# Patient Record
Sex: Male | Born: 1970 | Race: White | Hispanic: No | Marital: Single | Smoking: Current every day smoker
Health system: Southern US, Community
[De-identification: ages and names within clinical notes are randomized; demographics above are authoritative.]

## PROBLEM LIST (undated history)

## (undated) DIAGNOSIS — M5030 Other cervical disc degeneration, unspecified cervical region: Secondary | ICD-10-CM

## (undated) DIAGNOSIS — N319 Neuromuscular dysfunction of bladder, unspecified: Secondary | ICD-10-CM

## (undated) DIAGNOSIS — M069 Rheumatoid arthritis, unspecified: Secondary | ICD-10-CM

## (undated) DIAGNOSIS — I7300 Raynaud's syndrome without gangrene: Secondary | ICD-10-CM

## (undated) DIAGNOSIS — C801 Malignant (primary) neoplasm, unspecified: Secondary | ICD-10-CM

## (undated) DIAGNOSIS — M1990 Unspecified osteoarthritis, unspecified site: Secondary | ICD-10-CM

## (undated) DIAGNOSIS — K3184 Gastroparesis: Secondary | ICD-10-CM

## (undated) DIAGNOSIS — L409 Psoriasis, unspecified: Secondary | ICD-10-CM

## (undated) HISTORY — PX: FINGER AMPUTATION: SHX636

## (undated) HISTORY — PX: OTHER SURGICAL HISTORY: SHX169

## (undated) HISTORY — PX: APPENDECTOMY: SHX54

## (undated) HISTORY — PX: CHOLECYSTECTOMY: SHX55

## (undated) HISTORY — PX: ORCHIECTOMY: SHX2116

---

## 2017-03-26 ENCOUNTER — Emergency Department
Admission: EM | Admit: 2017-03-26 | Discharge: 2017-03-26 | Disposition: A | Discharge status: Home / Self Care | Payer: Medicare Other | Attending: Emergency Medicine | Admitting: Emergency Medicine

## 2017-03-26 ENCOUNTER — Emergency Department: Payer: Medicare Other

## 2017-03-26 DIAGNOSIS — W260XXS Contact with knife, sequela: Secondary | ICD-10-CM | POA: Insufficient documentation

## 2017-03-26 DIAGNOSIS — M79642 Pain in left hand: Secondary | ICD-10-CM | POA: Insufficient documentation

## 2017-03-26 DIAGNOSIS — Z7952 Long term (current) use of systemic steroids: Secondary | ICD-10-CM | POA: Insufficient documentation

## 2017-03-26 DIAGNOSIS — S61432S Puncture wound without foreign body of left hand, sequela: Secondary | ICD-10-CM | POA: Insufficient documentation

## 2017-03-26 DIAGNOSIS — Z79899 Other long term (current) drug therapy: Secondary | ICD-10-CM | POA: Insufficient documentation

## 2017-03-26 DIAGNOSIS — F17200 Nicotine dependence, unspecified, uncomplicated: Secondary | ICD-10-CM | POA: Insufficient documentation

## 2017-03-26 DIAGNOSIS — M1990 Unspecified osteoarthritis, unspecified site: Secondary | ICD-10-CM | POA: Insufficient documentation

## 2017-03-26 DIAGNOSIS — K3184 Gastroparesis: Secondary | ICD-10-CM | POA: Insufficient documentation

## 2017-03-26 HISTORY — DX: Malignant (primary) neoplasm, unspecified: C80.1

## 2017-03-26 HISTORY — DX: Gastroparesis: K31.84

## 2017-03-26 HISTORY — DX: Unspecified osteoarthritis, unspecified site: M19.90

## 2017-03-26 HISTORY — DX: Psoriasis, unspecified: L40.9

## 2017-03-26 LAB — CBC AND DIFFERENTIAL
Absolute NRBC: 0 10*3/uL
Basophils Absolute Automated: 0.06 10*3/uL (ref 0.00–0.20)
Basophils Automated: 0.4 %
Eosinophils Absolute Automated: 0.16 10*3/uL (ref 0.00–0.70)
Eosinophils Automated: 1.2 %
Hematocrit: 35.6 % — ABNORMAL LOW (ref 42.0–52.0)
Hgb: 11.4 g/dL — ABNORMAL LOW (ref 13.0–17.0)
Immature Granulocytes Absolute: 0.05 10*3/uL
Immature Granulocytes: 0.4 %
Lymphocytes Absolute Automated: 2.91 10*3/uL (ref 0.50–4.40)
Lymphocytes Automated: 21.5 %
MCH: 27 pg — ABNORMAL LOW (ref 28.0–32.0)
MCHC: 32 g/dL (ref 32.0–36.0)
MCV: 84.2 fL (ref 80.0–100.0)
MPV: 10 fL (ref 9.4–12.3)
Monocytes Absolute Automated: 1.47 10*3/uL — ABNORMAL HIGH (ref 0.00–1.20)
Monocytes: 10.9 %
Neutrophils Absolute: 8.86 10*3/uL — ABNORMAL HIGH (ref 1.80–8.10)
Neutrophils: 65.6 %
Nucleated RBC: 0 /100 WBC (ref 0.0–1.0)
Platelets: 415 10*3/uL — ABNORMAL HIGH (ref 140–400)
RBC: 4.23 10*6/uL — ABNORMAL LOW (ref 4.70–6.00)
RDW: 14 % (ref 12–15)
WBC: 13.51 10*3/uL — ABNORMAL HIGH (ref 3.50–10.80)

## 2017-03-26 LAB — BASIC METABOLIC PANEL
BUN: 14 mg/dL (ref 9.0–28.0)
CO2: 24 mEq/L (ref 22–29)
Calcium: 8.5 mg/dL (ref 8.5–10.5)
Chloride: 106 mEq/L (ref 100–111)
Creatinine: 0.9 mg/dL (ref 0.7–1.3)
Glucose: 99 mg/dL (ref 70–100)
Potassium: 4.2 mEq/L (ref 3.5–5.1)
Sodium: 138 mEq/L (ref 136–145)

## 2017-03-26 LAB — PT AND APTT
PT INR: 1 (ref 0.9–1.1)
PT: 13.2 s (ref 12.6–15.0)
PTT: 29 s (ref 23–37)

## 2017-03-26 LAB — GFR: EGFR: >60

## 2017-03-26 MED ORDER — HYDROMORPHONE HCL 1 MG/ML IJ SOLN
1.0000 mg | Freq: Once | INTRAMUSCULAR | Status: AC
Start: 2017-03-26 — End: 2017-03-26
  Administered 2017-03-26: 21:00:00 1 mg via INTRAVENOUS
  Filled 2017-03-26: qty 1

## 2017-03-26 MED ORDER — ONDANSETRON HCL 4 MG/2ML IJ SOLN
4.0000 mg | Freq: Once | INTRAMUSCULAR | Status: AC
Start: 2017-03-26 — End: 2017-03-26
  Administered 2017-03-26: 20:00:00 4 mg via INTRAVENOUS
  Filled 2017-03-26: qty 2

## 2017-03-26 MED ORDER — HYDROMORPHONE HCL 1 MG/ML IJ SOLN
1.0000 mg | Freq: Once | INTRAMUSCULAR | Status: AC
Start: 2017-03-26 — End: 2017-03-26
  Administered 2017-03-26: 20:00:00 1 mg via INTRAVENOUS
  Filled 2017-03-26: qty 1

## 2017-03-26 MED ORDER — HYDROMORPHONE HCL 2 MG PO TABS
2.0000 mg | ORAL_TABLET | Freq: Once | ORAL | Status: AC
Start: 2017-03-26 — End: 2017-03-26
  Administered 2017-03-26: 22:00:00 2 mg via ORAL
  Filled 2017-03-26: qty 1

## 2017-03-26 MED ORDER — HYDROMORPHONE HCL 1 MG/ML IJ SOLN
1.0000 mg | Freq: Once | INTRAMUSCULAR | Status: AC
Start: 2017-03-26 — End: 2017-03-26
  Administered 2017-03-26: 1 mg via INTRAVENOUS
  Filled 2017-03-26: qty 1

## 2017-03-26 MED ORDER — DIAZEPAM 5 MG PO TABS
5.0000 mg | ORAL_TABLET | Freq: Once | ORAL | Status: AC
Start: 2017-03-26 — End: 2017-03-26
  Administered 2017-03-26: 22:00:00 5 mg via ORAL
  Filled 2017-03-26: qty 1

## 2017-03-26 MED ORDER — DIAZEPAM 5 MG PO TABS
5.0000 mg | ORAL_TABLET | Freq: Four times a day (QID) | ORAL | 0 refills | Status: AC | PRN
Start: 2017-03-26 — End: ?

## 2017-03-26 NOTE — ED Provider Notes (Signed)
Tommy Rogers [facility] EMERGENCY DEPARTMENT H&P      Visit date: 03/26/2017      CLINICAL SUMMARY          Diagnosis:    .     Final diagnoses:   Left hand pain         MDM Notes:      [place] negative  Xray done at outside facility unremarkable  Discussed with Tommy Rogers, agrees with plan for discharge and will Rogers patient in the office tomorrow.    Discussed with patient that he should keep hand in warm environment.  Cont home pain medications.  Patient requesting Valium, prescribed.   No sign of infectious process of hand           Disposition:         Discharge         Discharge Prescriptions     Medication Sig Dispense Auth. Provider    diazePAM (VALIUM) 5 MG tablet Take 1 tablet (5 mg total) by mouth every 6 (six) hours as needed for Anxiety.for up to 10 doses 10 tablet Tommy Dun, MD                      CLINICAL INFORMATION        HPI:      Chief Complaint: Hand Pain and Arm Pain  .    Tommy Rogers is a 46 y.o. male with a pmhx of rheumatoid arthritis and testicular cancer who presents with 2 days of L thumb swelling a/w discoloration and moderate pain.    Tommy Rogers accidentally stabbed his thumb with a knife in a dishwasher two weeks ago. Two days ago, he noticed that his L thumb looked discolored and it started becoming very painful. Came as a transfer from [facility].    Denies: newer trauma to thumb    History obtained from: Patient          ROS:      Positive and negative ROS elements as per HPI.  All other systems reviewed and negative.      Physical Exam:      Pulse (!) 114  BP 124/78  Resp 17  SpO2 97 %  Temp 98.2 F (36.8 C)    Physical Exam   Constitutional: He is oriented to person, place, and time.   Appears uncomfortable   Eyes: Pupils are equal, round, and reactive to light. EOM are normal.   Pulmonary/Chest: Effort normal. No respiratory distress.   Musculoskeletal:   LUE: healing wound distal radius anterior aspect near radial artery.  Radial pulse is present proximal  and distal to lesion.  Significant left thumb tenderness.  Delayed cap refill. No obvious erythema, no area of skin breakdown   Neurological: He is alert and oriented to person, place, and time.   Distal sensation intact to light touch   Nursing note and vitals reviewed.                PAST HISTORY        Primary Care Provider: Christa See, MD        PMH/PSH:    .     Past Medical History:   Diagnosis Date   . Arthritis    . Gastroparesis    . Malignant neoplasm    . Psoriasis        He has a past surgical history that includes Appendectomy; Orchiectomy; Rhinoplasty; Lymph node dissection; and Septoplasty.  Social/Family History:      He reports that he has been smoking.  He has never used smokeless tobacco. He reports that he does not drink alcohol or use drugs.    No family history on file.      Listed Medications on Arrival:    .     Home Medications     Med List Status:  In Progress Set By: Tommy Counter, RN at 03/26/2017  7:46 PM                baclofen (LIORESAL) 20 MG tablet     Take 20 mg by mouth 3 (three) times daily.     citalopram (CELEXA) 40 MG tablet     Take 40 mg by mouth daily.     gabapentin (NEURONTIN) 300 MG capsule     Take 300 mg by mouth 3 (three) times daily.     HYDROmorphone (DILAUDID) 4 MG tablet     Take 4 mg by mouth every 4 (four) hours as needed for Pain.     magnesium hydroxide (MILK OF MAGNESIA) 400 MG/5ML suspension     Take by mouth daily as needed.     methylphenidate (RITALIN) 20 MG tablet     Take 20 mg by mouth 2 (two) times daily.     methylPREDNISolone (MEDROL) 4 MG tablet     Take 4 mg by mouth daily.     promethazine (PHENERGAN) 25 MG tablet     Take 25 mg by mouth every 6 (six) hours as needed for Nausea.     testosterone cypionate (DEPO-TESTOSTERONE) 100 MG/ML injection     Inject into the muscle once.         Allergies: He is allergic to ativan [lorazepam].            VISIT INFORMATION        Clinical Course in the ED:      ED Course as of Oct 02 0000   Mon  Mar 26, 2017   2012 Trauma aware.  [CM]   2032 Paged Tommy Rogers (hand)  [CM]   2041 Discussed with Tommy Rogers. Wants an arterial doppler.  [CM]   2048 Discussed with radiology b/c they needed approval for arterial doppler. After discussing with Radiology, they requested an arteriogram.   [CM]   2052 Paged Tommy Rogers (Tommy Rogers)  [CM]   2118 Paged Tommy x2  [CM]      ED Course User Index  [CM] Tommy Rogers     Tommy agrees with plan for arterial doppler      Medications Given in the ED:    .     ED Medication Orders     Start Ordered     Status Ordering Provider    03/26/17 2336 03/26/17 2335  HYDROmorphone (DILAUDID) injection 1 mg  Once     Route: Intravenous  Ordered Dose: 1 mg     Last MAR action:  Given Tommy Rogers    03/26/17 2157 03/26/17 2156  HYDROmorphone (DILAUDID) tablet 2 mg  Once     Route: Oral  Ordered Dose: 2 mg     Last MAR action:  Given Tommy Rogers Rogers    03/26/17 2156 03/26/17 2156  diazePAM (VALIUM) tablet 5 mg  Once     Route: Oral  Ordered Dose: 5 mg     Last MAR action:  Given Tommy Rogers    03/26/17 2053 03/26/17 2052  HYDROmorphone (DILAUDID)  injection 1 mg  Once     Route: Intravenous  Ordered Dose: 1 mg     Last MAR action:  Given Tommy Rogers    03/26/17 2011 03/26/17 2010  ondansetron (ZOFRAN) injection 4 mg  Once     Route: Intravenous  Ordered Dose: 4 mg     Last MAR action:  Given Tommy Rogers    03/26/17 2010 03/26/17 2010  HYDROmorphone (DILAUDID) injection 1 mg  Once     Route: Intravenous  Ordered Dose: 1 mg     Last MAR action:  Given Tommy Rogers            Procedures:      Procedures      Interpretations:      O2 sat-           saturation: 97 %; Oxygen use: room air; Interpretation: Normal       I reviewed the past medical history, past surgical history, and social history.    Differential Dx (not completely inclusive): arterial injury, pseudoaneurysm, RA flare, gout,     Laboratory results reviewed by EDP: Yes  Radiologic study results reviewed by EDP: Yes    Old  medical records reviewed by me: yes                  RESULTS        Lab Results:      Results     Procedure Component Value Units Date/Time    PT/APTT [[id number]] Collected:  03/26/17 2051     Updated:  03/26/17 2123     PT 13.2 sec      PT INR 1.0     PT Anticoag. Given Within 48 hrs. None     PTT 29 sec     Basic Metabolic Panel [[id number]] Collected:  03/26/17 2052    Specimen:  Blood Updated:  03/26/17 2112     Glucose 99 mg/dL      BUN 87.5 mg/dL      Creatinine 0.9 mg/dL      Calcium 8.5 mg/dL      Sodium 643 mEq/L      Potassium 4.2 mEq/L      Chloride 106 mEq/L      CO2 24 mEq/L     GFR [[id number]] Collected:  03/26/17 2052     Updated:  03/26/17 2112     EGFR >60.0    CBC with differential [[id number]]  (Abnormal) Collected:  03/26/17 2051    Specimen:  Blood from Blood Updated:  03/26/17 2102     WBC 13.51 (H) x10 3/uL      Hgb 11.4 (L) g/dL      Hematocrit 10.9 (L) %      Platelets 415 (H) x10 3/uL      RBC 4.23 (L) x10 6/uL      MCV 84.2 fL      MCH 27.0 (L) pg      MCHC 32.0 g/dL      RDW 14 %      MPV 10.0 fL      Neutrophils 65.6 %      Lymphocytes Automated 21.5 %      Monocytes 10.9 %      Eosinophils Automated 1.2 %      Basophils Automated 0.4 %      Immature Granulocyte 0.4 %      Nucleated RBC 0.0 /100 WBC      Neutrophils Absolute 8.86 (H)  x10 3/uL      Abs Lymph Automated 2.91 x10 3/uL      Abs Mono Automated 1.47 (H) x10 3/uL      Abs Eos Automated 0.16 x10 3/uL      Absolute Baso Automated 0.06 x10 3/uL      Absolute Immature Granulocyte 0.05 x10 3/uL      Absolute NRBC 0.00 x10 3/uL               Radiology Results:      US Arterial or Graft Dopp Left Upper Extrem    (Results Pending)   [place] Noninvas Up Extrem Art Dopp/Press/Wavefrms (PVR) Comp 3-4 Levels    (Results Pending)               Scribe Attestation:      I was acting as a scribe for Tommy Dun, MD on [place]  Treatment Team: Scribe: Courtney Heys     I am the first provider for this patient and I personally performed the  services documented. Treatment Team: Scribe: Courtney Heys is scribing for me on [place]. This note and the patient instructions accurately reflect work and decisions made by me.  Tommy Dun, MD          Tommy Dun, MD  03/27/17 Jorje Guild

## 2017-03-26 NOTE — EDIE (Signed)
Tommy Rogers?NOTIFICATION?03/26/2017 19:26?Rogers, Tommy?MRN: [id number]    This patient has registered at the [facility] Emergency Department   For more information visit: [email or web address]   Criteria met      3 Different Facilities in 90 Days    5 Tommy Visits in 12 Months    Security Events  No recent Security Events currently on file    Tommy Care Guidelines  There are currently no Tommy Care Guidelines in Tommy Rogers for this patient. Please check your facility's medical records system.    Care Providers  Tommy Rogers has no care providers on record at this time.   E.D. Visit Count (12 mo.)  Facility Visits   [facility] 3   [facility] 4   [facility] 4   [facility] 1   Tom[facility] 1   [facility] 1   Total 14   Note: Visits indicate total known visits.      Recent Emergency Department Visit Summary  Admit Date Facility [facility] Type Major Type Diagnoses or Chief Complaint   Mar 26, 2017 Tommy Rogers. [facility]  Emergency      Cold, mottled hand[facility]      Arm Pain      Hand Pain      Mar 26, 2017 [facility]  Emergency      SWELLING AND PAIN IN LEFT HAND      HAND PAIN HAND SWELLING      Mar 16, 2017 [facility]  Emergency     Feb 28, 2017 [facility]  Emergency      LOWER BACK PAIN, ABNORMAL FLUIDS FROM LESION      BACK PAIN      Other chronic pain      Low back pain      Feb 16, 2017 [facility]  Emergency     Jan 30, 2017 [facility]  Emergency      Retention of urine, unspecified      Secondary malignant neoplasm of bone      Malignant neoplasm of unspecified testis, unspecified whether descended or undescended      Urinary tract infection, site not specified      Jan 29, 2017 [facility]  Emergency      Malignant neoplasm of  unspecified testis, unspecified whether descended or undescended      Low back pain      Secondary and unspecified malignant neoplasm of lymph node, unspecified      Nicotine dependence, cigarettes, uncomplicated      Neoplasm related pain (acute) (chronic)      Jan 08, 2017 [facility]  Emergency      Personal history of malignant neoplasm of other organs and systems      Personal history of malignant neoplasm of testis      Nicotine dependence, cigarettes, uncomplicated      Cystitis, unspecified with hematuria      Allergy status to other drugs, medicaments and biological substances status      Retention of urine, unspecified      Jan 06, 2017 [facility]  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      Secondary malignant neoplasm of bone      Hypotension, unspecified      Other specified postprocedural states      Rheumatoid arthritis, unspecified      Acquired absence of other genital organ(s)            Prescription Monitoring Program  000??- Narcotic Use Score  000??- Sedative Use Score  000??- Stimulant Use Score  - All Scores range from 000-999 with 75% of the population scoring < 200 and on 1% scoring above 650  - The last digit of the narcotic, sedative, and stimulant score indicates the number of active prescriptions of that type  - Higher Use scores correlate with increased prescribers, pharmacies, mg equiv, and overlapping prescriptions   Concerning or unexpectedly high scores should prompt a review of the PMP record; this does not constitute checking PMP for prescribing  purposes.    The above information is provided for the sole purpose of patient treatment. Use of this information beyond the terms of Data Sharing Memorandum of Understanding and License Agreement is prohibited. In certain cases not all visits may be represented. Consult the aforementioned facilities for additional information.   ? 2018 [employer] - [place], [place] - [email or web address]

## 2017-03-26 NOTE — Discharge Instructions (Signed)
Keep hand in warm environment    Return for fever, hand redness - any change or worsening of condition    Follow up with Dr. Sharolyn Douglas (hand specialist) tomorrow

## 2017-11-19 ENCOUNTER — Emergency Department (HOSPITAL_COMMUNITY): Payer: Medicare Other

## 2017-11-19 ENCOUNTER — Emergency Department (HOSPITAL_COMMUNITY)
Admission: EM | Admit: 2017-11-19 | Discharge: 2017-11-20 | Disposition: A | Discharge status: Home / Self Care | Payer: Medicare Other | Attending: Emergency Medicine | Admitting: Emergency Medicine

## 2017-11-19 ENCOUNTER — Encounter (HOSPITAL_COMMUNITY): Payer: Self-pay | Admitting: Emergency Medicine

## 2017-11-19 ENCOUNTER — Other Ambulatory Visit: Payer: Self-pay

## 2017-11-19 DIAGNOSIS — R4182 Altered mental status, unspecified: Secondary | ICD-10-CM | POA: Insufficient documentation

## 2017-11-19 DIAGNOSIS — Z8547 Personal history of malignant neoplasm of testis: Secondary | ICD-10-CM | POA: Diagnosis not present

## 2017-11-19 DIAGNOSIS — F17200 Nicotine dependence, unspecified, uncomplicated: Secondary | ICD-10-CM | POA: Insufficient documentation

## 2017-11-19 DIAGNOSIS — M542 Cervicalgia: Secondary | ICD-10-CM

## 2017-11-19 DIAGNOSIS — Z79899 Other long term (current) drug therapy: Secondary | ICD-10-CM | POA: Insufficient documentation

## 2017-11-19 HISTORY — DX: Other cervical disc degeneration, unspecified cervical region: M50.30

## 2017-11-19 HISTORY — DX: Malignant (primary) neoplasm, unspecified: C80.1

## 2017-11-19 HISTORY — DX: Rheumatoid arthritis, unspecified: M06.9

## 2017-11-19 HISTORY — DX: Neuromuscular dysfunction of bladder, unspecified: N31.9

## 2017-11-19 HISTORY — DX: Raynaud's syndrome without gangrene: I73.00

## 2017-11-19 LAB — URINALYSIS, ROUTINE W REFLEX MICROSCOPIC
BILIRUBIN URINE: NEGATIVE
GLUCOSE, UA: NEGATIVE mg/dL
HGB URINE DIPSTICK: NEGATIVE
KETONES UR: NEGATIVE mg/dL
Leukocytes, UA: NEGATIVE
Nitrite: NEGATIVE
PROTEIN: NEGATIVE mg/dL
Specific Gravity, Urine: 1.011 (ref 1.005–1.030)
pH: 5 (ref 5.0–8.0)

## 2017-11-19 LAB — CBC WITH DIFFERENTIAL/PLATELET
Basophils Absolute: 0.1 10*3/uL (ref 0.0–0.1)
Basophils Relative: 1 %
Eosinophils Absolute: 0.3 10*3/uL (ref 0.0–0.7)
Eosinophils Relative: 1 %
HEMATOCRIT: 37 % — AB (ref 39.0–52.0)
Hemoglobin: 11.3 g/dL — ABNORMAL LOW (ref 13.0–17.0)
LYMPHS ABS: 4.8 10*3/uL (ref 0.7–4.0)
LYMPHS PCT: 24 %
MCH: 24.6 pg — ABNORMAL LOW (ref 26.0–34.0)
MCHC: 30.5 g/dL (ref 30.0–36.0)
MCV: 80.4 fL (ref 78.0–100.0)
Monocytes Absolute: 1.1 10*3/uL (ref 0.1–1.0)
Monocytes Relative: 6 %
NEUTROS PCT: 68 %
Neutro Abs: 13.8 10*3/uL (ref 1.7–7.7)
PLATELETS: 592 10*3/uL — AB (ref 150–400)
RBC: 4.6 MIL/uL (ref 4.22–5.81)
RDW: 14.8 % (ref 11.5–15.5)
WBC: 20.1 10*3/uL — AB (ref 4.0–10.5)

## 2017-11-19 LAB — COMPREHENSIVE METABOLIC PANEL
ALBUMIN: 4.3 g/dL (ref 3.5–5.0)
ALT: 21 U/L (ref 17–63)
AST: 16 U/L (ref 15–41)
Alkaline Phosphatase: 78 U/L (ref 38–126)
Anion gap: 10 (ref 5–15)
BILIRUBIN TOTAL: 0.4 mg/dL (ref 0.3–1.2)
BUN: 18 mg/dL (ref 6–20)
CHLORIDE: 107 mmol/L (ref 101–111)
CO2: 25 mmol/L (ref 22–32)
CREATININE: 0.91 mg/dL (ref 0.61–1.24)
Calcium: 9.5 mg/dL (ref 8.9–10.3)
GFR calc Af Amer: >60 mL/min (ref >60)
GLUCOSE: 92 mg/dL (ref 65–99)
Potassium: 4.2 mmol/L (ref 3.5–5.1)
Sodium: 142 mmol/L (ref 135–145)
Total Protein: 8.6 g/dL — ABNORMAL HIGH (ref 6.5–8.1)

## 2017-11-19 LAB — RAPID URINE DRUG SCREEN, HOSP PERFORMED
Amphetamines: NOT DETECTED
Barbiturates: NOT DETECTED
Benzodiazepines: NOT DETECTED
Cocaine: NOT DETECTED
OPIATES: NOT DETECTED
TETRAHYDROCANNABINOL: NOT DETECTED

## 2017-11-19 LAB — I-STAT CG4 LACTIC ACID, ED: Lactic Acid, Venous: 0.8 mmol/L (ref 0.5–1.9)

## 2017-11-19 LAB — CK: CK TOTAL: 57 U/L (ref 49–397)

## 2017-11-19 LAB — ETHANOL

## 2017-11-19 MED ORDER — IOHEXOL 300 MG/ML  SOLN
75.0000 mL | Freq: Once | INTRAMUSCULAR | Status: AC | PRN
  Administered 2017-11-19: 75 mL via INTRAVENOUS

## 2017-11-19 MED ORDER — DIAZEPAM 5 MG PO TABS
5.0000 mg | ORAL_TABLET | Freq: Once | ORAL | Status: AC
  Administered 2017-11-19: 5 mg via ORAL
  Filled 2017-11-19: qty 1

## 2017-11-19 MED ORDER — SODIUM CHLORIDE 0.9 % IV BOLUS
1000.0000 mL | Freq: Once | INTRAVENOUS | Status: AC
  Administered 2017-11-19: 1000 mL via INTRAVENOUS

## 2017-11-19 MED ORDER — METHOCARBAMOL 500 MG PO TABS
1000.0000 mg | ORAL_TABLET | Freq: Once | ORAL | Status: AC
  Administered 2017-11-19: 1000 mg via ORAL
  Filled 2017-11-19: qty 2

## 2017-11-19 MED ORDER — KETOROLAC TROMETHAMINE 30 MG/ML IJ SOLN
30.0000 mg | Freq: Once | INTRAMUSCULAR | Status: AC
  Administered 2017-11-19: 30 mg via INTRAVENOUS
  Filled 2017-11-19: qty 1

## 2017-11-19 NOTE — ED Triage Notes (Signed)
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 States it started after he attempted to help friend lift a dryer.

## 2017-11-19 NOTE — ED Provider Notes (Addendum)
[facility] EMERGENCY DEPARTMENT Provider Note   CSN: [id number] Arrival date & time: 11/19/17  1611     History   Chief Complaint Chief Complaint  Patient presents with  . Neck Pain    HPI Alexander Porter is a 47 y.o. male.  HPI Patient presents with 3 days of left-sided neck pain after helping a friend move a dryer.  States the pain radiates to his arm.  No known trauma to the neck.  No fever or chills.  States he does have some numbness of the hand but unsure whether that is related to his Raynauds.  Patient is on opiates for chronic pain.  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 Past Medical History:  Diagnosis Date  . DDD (degenerative disc disease), cervical   . Neurogenic bladder   . Raynaud disease   . Rheumatoid arthritis ([place])   . testicular     There are no active problems to display for this patient.   Past Surgical History:  Procedure Laterality Date  . APPENDECTOMY    . bilateral release knee    . CHOLECYSTECTOMY    . FINGER AMPUTATION    . lymph node resection    . ORCHIECTOMY          Home Medications    Prior to Admission medications   Medication Sig Start Date End Date Taking? Authorizing Provider  albuterol (VENTOLIN HFA) 108 (90 Base) MCG/ACT inhaler INHALE 2 PUFFS BY MOUTH EVERY 4 HOURS AS NEEDED FOR WHEEZING OR SHORTNESS OF BREATH 08/29/17  Yes [provider]  amitriptyline (ELAVIL) 25 MG tablet Take 50 mg by mouth at bedtime as needed for sleep.  02/19/15  Yes [provider]  baclofen (LIORESAL) 10 MG tablet Take 10 mg by mouth 3 (three) times daily as needed. 11/15/17  Yes [provider]  betamethasone valerate (VALISONE) 0.1 % cream APPLY TOPICALLY TO AFFECTED AREA TWICE DAILY AS NEEDED 02/19/15  Yes [provider]  citalopram (CELEXA) 40 MG tablet Take 40 mg by mouth daily. 12/20/14  Yes [provider]    clotrimazole-betamethasone (LOTRISONE) cream APPLY TOPICALLY TO AFFECTED AREA TWICE DAILY 02/19/15  Yes [provider]  dicyclomine (BENTYL) 20 MG tablet Take 20 mg by mouth every 6 (six) hours as needed.   Yes [provider]  famotidine (PEPCID) 20 MG tablet Take 20 mg by mouth 2 (two) times daily.    Yes [provider]  fluticasone (FLOVENT HFA) 110 MCG/ACT inhaler Inhale 1 puff into the lungs 2 (two) times daily.   Yes [provider]  Fluticasone Furoate (ARNUITY ELLIPTA) 100 MCG/ACT AEPB Inhale 1 puff into the lungs daily.  08/29/17  Yes [provider]  gabapentin (NEURONTIN) 400 MG capsule Take 1,600 mg by mouth 2 (two) times daily. 10/26/17  Yes [provider]  lidocaine (XYLOCAINE) 2 % solution USE AS DIRECTED ON CATHETER ADMINISTRATION 10/26/17  Yes [provider]  methylphenidate (RITALIN) 20 MG tablet Take 20 mg by mouth 2 (two) times daily. 10/25/17  Yes [provider]  metoCLOPramide (REGLAN) 10 MG tablet Take 10 mg by mouth every 6 (six) hours as needed.   Yes [provider]  morphine (MS CONTIN) 15 MG 12 hr tablet Take 15 mg by mouth every 12 (twelve) hours. 02/19/15  Yes [provider]  morphine (MS CONTIN) 30 MG 12 hr tablet Take 30 mg by mouth daily.  Yes [provider]  morphine (MSIR) 15 MG tablet Take 15 mg by mouth daily. 10/26/17  Yes [provider]  NIFEdipine (PROCARDIA XL/ADALAT-CC) 60 MG 24 hr tablet Take 60 mg by mouth daily. 08/29/17  Yes [provider]  nitroGLYCERIN (NITRO-BID) 2 % ointment Apply 1 application topically every 6 (six) hours. 08/11/17  Yes [provider]  pantoprazole (PROTONIX) 40 MG tablet Take 40 mg by mouth daily.    Yes [provider]  predniSONE (DELTASONE) 20 MG tablet Take 20 mg by mouth daily. 08/29/17  Yes [provider]  promethazine (PHENERGAN) 25 MG tablet Take 25 mg by mouth every 6 (six) hours as  needed for nausea or vomiting.  08/01/17  Yes [provider]  propranolol ER (INDERAL LA) 80 MG 24 hr capsule Take 80 mg by mouth every morning. 10/26/17  Yes [provider]  Rivaroxaban (XARELTO STARTER PACK) 15 & 20 MG TBPK Take 15 mg by mouth 2 (two) times daily. 11/08/17  Yes [provider]  senna-docusate (SENEXON-S) 8.6-50 MG tablet Take 1 tablet by mouth daily. 10/26/17  Yes [provider]  tadalafil (CIALIS) 10 MG tablet Take 10 mg by mouth daily as needed.    Yes [provider]  TESTOSTERONE CYPIONATE IJ Inject 300 mg into the muscle every 14 (fourteen) days. 01/07/15  Yes [provider]  ondansetron (ZOFRAN-ODT) 8 MG disintegrating tablet Take 8 mg by mouth every 8 (eight) hours as needed.    [provider]    Family History History reviewed. No pertinent family history.  Social History Social History   Tobacco Use  . Smoking status: Current Every Day Smoker  . Smokeless tobacco: Never Used  Substance Use Topics  . Alcohol use: Never    Frequency: Never  . Drug use: Never     Allergies   Lorazepam; Piperacillin-tazobactam in dex; and Zosyn [piperacillin sod-tazobactam so]   Review of Systems Review of Systems  Constitutional: Negative for chills and fever.  HENT: Negative for congestion, sore throat, trouble swallowing and voice change.   Eyes: Negative for visual disturbance.  Respiratory: Negative for shortness of breath.   Cardiovascular: Negative for chest pain, palpitations and leg swelling.  Gastrointestinal: Positive for nausea and vomiting. Negative for abdominal pain.  Genitourinary: Negative for dysuria, flank pain, frequency and hematuria.  Musculoskeletal: Positive for myalgias, neck pain and neck stiffness. Negative for back pain.  Skin: Negative for rash and wound.  Neurological: Positive for numbness. Negative for dizziness, speech difficulty, weakness, light-headedness and headaches.    Psychiatric/Behavioral: Positive for agitation. The patient is nervous/anxious.   All other systems reviewed and are negative.    Physical Exam Updated Vital Signs BP (!) 152/97 (BP Location: Left Arm)   Pulse (!) 103   Temp 98.4 F (36.9 C) (Oral)   Resp 17   Ht 5\' 7"  (1.702 m)   Wt 68 kg (150 lb)   SpO2 100%   BMI 23.49 kg/m   Physical Exam  Constitutional: He is oriented to person, place, and time. He appears well-developed and well-nourished. He appears distressed.  HENT:  Head: Normocephalic and atraumatic.  Mouth/Throat: Oropharynx is clear and moist.  Eyes: Pupils are equal, round, and reactive to light. EOM are normal.  Neck: Normal range of motion. Neck supple.  Diffuse posterior cervical tenderness to palpation.  Spasm appreciated in the left parous cervical paracervical and trapezius musculature.  No inguinal lymphadenopathy no cervical lymphadenopathy.  Cardiovascular: Regular rhythm.  Tachycardia  Pulmonary/Chest: Effort normal and breath sounds normal.  Abdominal: Soft. Bowel sounds are normal. There is no tenderness. There is no rebound and no guarding.  Musculoskeletal: Normal range of motion. He exhibits no edema or tenderness.  Distal pulses intact.  Patient states he has worsening neck pain with movement of his arm.  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  No erythema or warmth.  Lymphadenopathy:    He has no cervical adenopathy.  Neurological: He is alert and oriented to person, place, and time.  5/5 motor in all extremities.  Sensation grossly intact.  Skin: Skin is warm and dry. No rash noted. No erythema.  Psychiatric:  Anxious.  Pressured speech.  Nursing note and vitals reviewed.    ED Treatments / Results  Labs (all labs ordered are listed, but only abnormal results are displayed) Labs Reviewed  CBC WITH DIFFERENTIAL/PLATELET - Abnormal; Notable for the following components:      Result Value   WBC 20.1 (*)     Hemoglobin 11.3 (*)    HCT 37.0 (*)    MCH 24.6 (*)    Platelets 592 (*)    All other components within normal limits  COMPREHENSIVE METABOLIC PANEL - Abnormal; Notable for the following components:   Total Protein 8.6 (*)    All other components within normal limits  URINALYSIS, ROUTINE W REFLEX MICROSCOPIC - Abnormal; Notable for the following components:   Color, Urine STRAW (*)    All other components within normal limits  CULTURE, BLOOD (ROUTINE X 2)  CULTURE, BLOOD (ROUTINE X 2)  RAPID URINE DRUG SCREEN, HOSP PERFORMED  ETHANOL  CK  I-STAT CG4 LACTIC ACID, ED    EKG EKG Interpretation  Date/Time:  Monday Nov 19 2017 18:48:42 EDT Ventricular Rate:  96 PR Interval:    QRS Duration: 77 QT Interval:  348 QTC Calculation: 440 R Axis:   81 Text Interpretation:  Sinus rhythm Ventricular premature complex Confirmed by Julianne Rice [id number]) on 11/19/2017 9:46:26 PM   Radiology Dg Cervical Spine Complete  Result Date: 11/19/2017 CLINICAL DATA:  Neck pain. EXAM: CERVICAL SPINE - COMPLETE 4+ VIEW COMPARISON:  None. FINDINGS: Only the first 6 cervical vertebra are completely visualized. No significant abnormality seen involving the visualized cervical vertebra. C7 pathology cannot be excluded. IMPRESSION: No definite abnormality seen in the first 6 cervical vertebra. C7 is not completely visualized and therefore pathology cannot be excluded. CT scan may be performed for further evaluation. Electronically Signed   By: Marijo Conception, M.D.   On: 11/19/2017 20:37   Ct Head Wo Contrast  Result Date: 11/19/2017 CLINICAL DATA:  Altered mental status. Neck pain. Metastatic testicular cancer. EXAM: CT HEAD WITHOUT CONTRAST CT CERVICAL SPINE WITHOUT CONTRAST TECHNIQUE: Multidetector CT imaging of the head and cervical spine was performed following the standard protocol without intravenous contrast. Multiplanar CT image reconstructions of the cervical spine were also generated. COMPARISON:   None. FINDINGS: CT HEAD FINDINGS Brain: There is no mass, hemorrhage or extra-axial collection. The size and configuration of the ventricles and extra-axial CSF spaces are normal. There is no acute or chronic infarction. The brain parenchyma is normal. Vascular: No abnormal hyperdensity of the major intracranial arteries or dural venous sinuses. No intracranial atherosclerosis. Skull: The visualized skull base, calvarium and extracranial soft tissues are normal. Sinuses/Orbits: No fluid levels or advanced mucosal thickening of the visualized paranasal sinuses. No mastoid or middle ear effusion. The orbits are normal. CT CERVICAL SPINE  FINDINGS Alignment: No static subluxation. Facets are aligned. Occipital condyles are normally positioned. Skull base and vertebrae: No acute fracture. Soft tissues and spinal canal: No prevertebral fluid or swelling. No visible canal hematoma. Disc levels: No advanced spinal canal or neural foraminal stenosis. Upper chest: No pneumothorax, pulmonary nodule or pleural effusion. Other: Normal visualized paraspinal cervical soft tissues. IMPRESSION: Normal CT head and cervical spine. Electronically Signed   By: Ulyses Jarred M.D.   On: 11/19/2017 22:26   Ct Soft Tissue Neck W Contrast  Result Date: 11/19/2017 CLINICAL DATA:  Neck pain.  History of metastatic testicular cancer. EXAM: CT NECK WITH CONTRAST TECHNIQUE: Multidetector CT imaging of the neck was performed using the standard protocol following the bolus administration of intravenous contrast. CONTRAST:  71mL OMNIPAQUE IOHEXOL 300 MG/ML  SOLN COMPARISON:  CT cervical spine 11/19/2017 FINDINGS: PHARYNX AND LARYNX: --Nasopharynx: Fossae of [place] are clear. Normal adenoid tonsils for age. --Oral cavity and oropharynx: The palatine and lingual tonsils are normal. The visible oral cavity and floor of mouth are normal. --Hypopharynx: Normal vallecula and pyriform sinuses. --Larynx: Normal epiglottis and pre-epiglottic space.  Normal aryepiglottic and vocal folds. --Retropharyngeal space: No abscess, effusion or lymphadenopathy. SALIVARY GLANDS: --Parotid: No mass lesion or inflammation. No sialolithiasis or ductal dilatation. --Submandibular: Symmetric without inflammation. No sialolithiasis or ductal dilatation. --Sublingual: Normal. No ranula or other visible lesion of the base of tongue and floor of mouth. THYROID: Normal. LYMPH NODES: No enlarged or abnormal density lymph nodes. VASCULAR: Major cervical vessels are patent. LIMITED INTRACRANIAL: Normal. VISUALIZED ORBITS: Normal. MASTOIDS AND VISUALIZED PARANASAL SINUSES: No fluid levels or advanced mucosal thickening. No mastoid effusion. SKELETON: No bony spinal canal stenosis. No lytic or blastic lesions. UPPER CHEST: Biapical emphysema with multiple bullae. OTHER: None. IMPRESSION: 1. No cervical lymphadenopathy or other soft tissue mass. 2.  Emphysema (ICD10-J43.9). Electronically Signed   By: Ulyses Jarred M.D.   On: 11/19/2017 23:48   Ct Cervical Spine Wo Contrast  Result Date: 11/19/2017 CLINICAL DATA:  Altered mental status. Neck pain. Metastatic testicular cancer. EXAM: CT HEAD WITHOUT CONTRAST CT CERVICAL SPINE WITHOUT CONTRAST TECHNIQUE: Multidetector CT imaging of the head and cervical spine was performed following the standard protocol without intravenous contrast. Multiplanar CT image reconstructions of the cervical spine were also generated. COMPARISON:  None. FINDINGS: CT HEAD FINDINGS Brain: There is no mass, hemorrhage or extra-axial collection. The size and configuration of the ventricles and extra-axial CSF spaces are normal. There is no acute or chronic infarction. The brain parenchyma is normal. Vascular: No abnormal hyperdensity of the major intracranial arteries or dural venous sinuses. No intracranial atherosclerosis. Skull: The visualized skull base, calvarium and extracranial soft tissues are normal. Sinuses/Orbits: No fluid levels or advanced mucosal  thickening of the visualized paranasal sinuses. No mastoid or middle ear effusion. The orbits are normal. CT CERVICAL SPINE FINDINGS Alignment: No static subluxation. Facets are aligned. Occipital condyles are normally positioned. Skull base and vertebrae: No acute fracture. Soft tissues and spinal canal: No prevertebral fluid or swelling. No visible canal hematoma. Disc levels: No advanced spinal canal or neural foraminal stenosis. Upper chest: No pneumothorax, pulmonary nodule or pleural effusion. Other: Normal visualized paraspinal cervical soft tissues. IMPRESSION: Normal CT head and cervical spine. Electronically Signed   By: Ulyses Jarred M.D.   On: 11/19/2017 22:26    Procedures Procedures (including critical care time)  Medications Ordered in ED Medications  methocarbamol (ROBAXIN) tablet 1,000 mg (1,000 mg Oral Given 11/19/17 1839)  ketorolac (TORADOL) 30  MG/ML injection 30 mg (30 mg Intravenous Given 11/19/17 1853)  diazepam (VALIUM) tablet 5 mg (5 mg Oral Given 11/19/17 2115)  sodium chloride 0.9 % bolus 1,000 mL (0 mLs Intravenous Stopped 11/20/17 0024)  diazepam (VALIUM) tablet 5 mg (5 mg Oral Given 11/19/17 2303)  iohexol (OMNIPAQUE) 300 MG/ML solution 75 mL (75 mLs Intravenous Contrast Given 11/19/17 2307)  ondansetron (ZOFRAN) injection 4 mg (4 mg Intravenous Given 11/20/17 0021)     Initial Impression / Assessment and Plan / ED Course  I have reviewed the triage vital signs and the nursing notes.  Pertinent labs & imaging results that were available during my care of the patient were reviewed by me and considered in my medical decision making (see chart for details).     Patient has recently been evaluated by 4 healthcare facilities and 3 states.  He has had imaging studies of spine, chest, abdomen and pelvis.  Had bone scan performed at [facility] health care on 12/04/2016 with no evidence of osseous metastatic disease.  Had CT abdomen pelvis with contrast performed 07/11/2017 at [facility]  without acute findings.  Patient had CT chest with contrast on 11/14/2016 with severe COPD but no acute findings. Patient had MRI lumbar spine with and without contrast performed 10/03/2016 and 11/13/2016 without evidence of metastatic disease.  He did have some mild disc bulge at L3-L4, L4-5.  Patient with normal plain films of the cervical spine.  He does have a leukocytosis.  He is on chronic prednisone for his COPD and this may be a source of his leukocytosis.  His tachycardia has significantly improved.  Patient appears more calm.  Continues to have tenderness palpation especially of the left trapezius but does not have definite meningeal signs.  Oncoming emergency physician pending CT results and reevaluation after repeat medication.  Anticipate discharge home. Final Clinical Impressions(s) / ED Diagnoses   Final diagnoses:  Neck pain    ED Discharge Orders    None       Julianne Rice, MD 11/20/17 2050    Julianne Rice, MD 11/20/17 2051

## 2017-11-19 NOTE — Discharge Instructions (Addendum)
Continue your pain medicine that she have her chronic pain.  Rest is much as possible.  Continue your muscle relaxers.  CT head neck and CT soft tissue neck without any acute findings.  Would recommend follow-up with your doctors and would recommend an MRI of the neck.

## 2017-11-20 DIAGNOSIS — M542 Cervicalgia: Secondary | ICD-10-CM | POA: Diagnosis not present

## 2017-11-20 MED ORDER — ONDANSETRON HCL 4 MG/2ML IJ SOLN
4.0000 mg | Freq: Once | INTRAMUSCULAR | Status: AC
  Administered 2017-11-20: 4 mg via INTRAVENOUS
  Filled 2017-11-20: qty 2

## 2017-11-20 NOTE — ED Provider Notes (Signed)
CT scan head neck as well as soft tissue neck added on by me without any acute findings.  Patient has chronic pain management meds available at home as follow-up would recommend outpatient MRI.  Patient stable for discharge home.   Fredia Sorrow, MD 11/20/17 Dyann Kief

## 2017-11-24 LAB — CULTURE, BLOOD (ROUTINE X 2)
CULTURE: NO GROWTH
Culture: NO GROWTH
SPECIAL REQUESTS: ADEQUATE
Special Requests: ADEQUATE

## 2018-09-24 IMAGING — CT CT NECK W/ CM
4 of 5 series · 16 of 33 positions shown, 18 images · IV contrast (omnipaque)
Comparison: CT cervical spine 11/19/2017

CLINICAL DATA: Neck pain.  History of metastatic testicular cancer.

EXAM:
CT NECK WITH CONTRAST
TECHNIQUE: Multidetector CT imaging of the neck was performed using the
standard protocol following the bolus administration of intravenous
contrast.
CONTRAST:  75mL OMNIPAQUE IOHEXOL 300 MG/ML  SOLN

[Series 2: axial neck · axial · 0.53mm/px · z∈[-21,+127]mm · 4 of 124 slices shown, 5 images]
[im 25/124  soft-tissue]
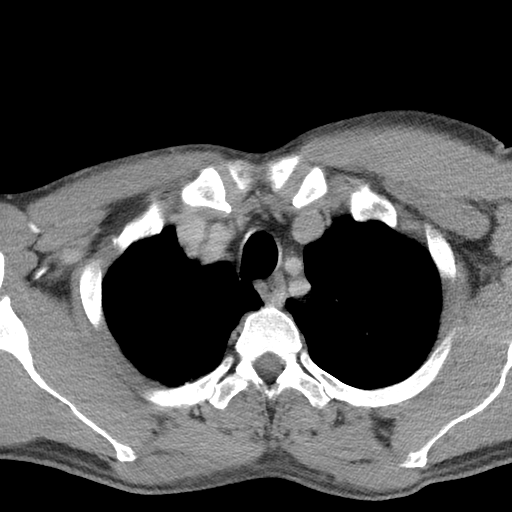
[im 25/124  bone]
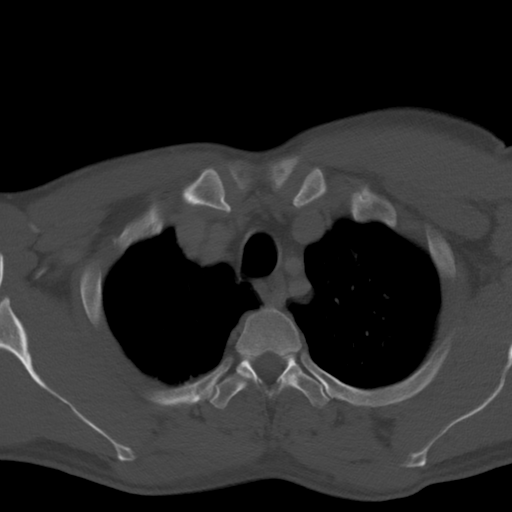
[im 50/124  bone]
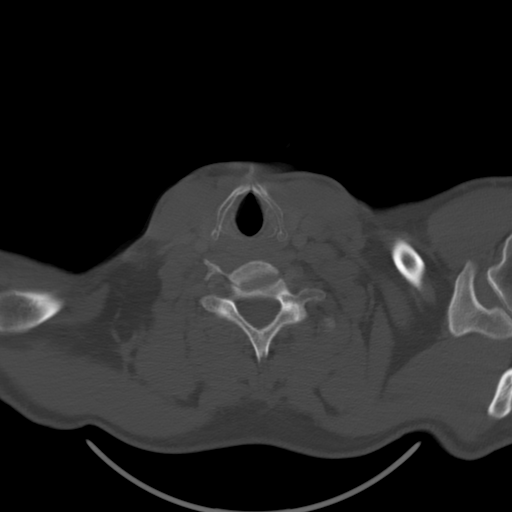
[im 74/124  bone]
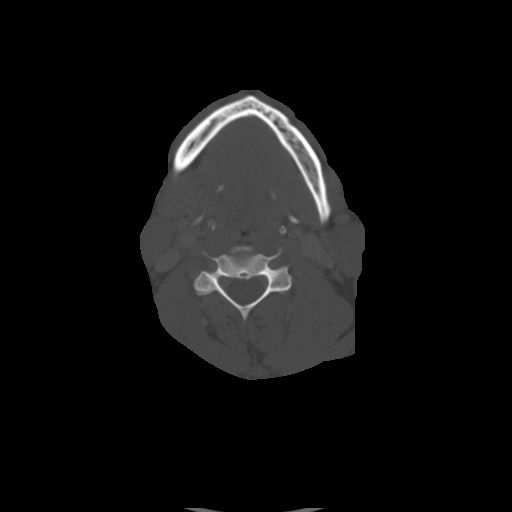
[im 99/124  bone]
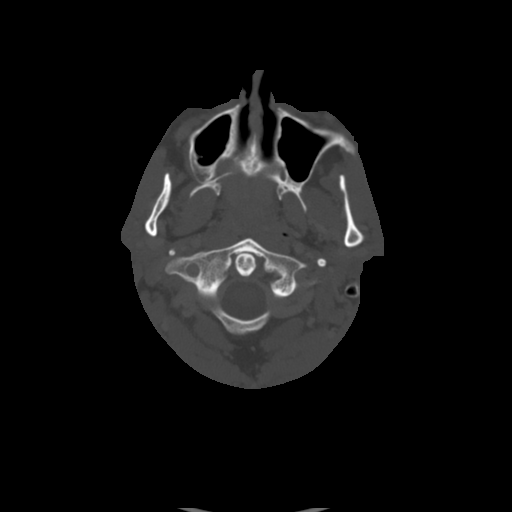

[Series 6: coronal neck · coronal · 0.48mm/px · 3 of 115 slices shown]
[im 23/115  bone]
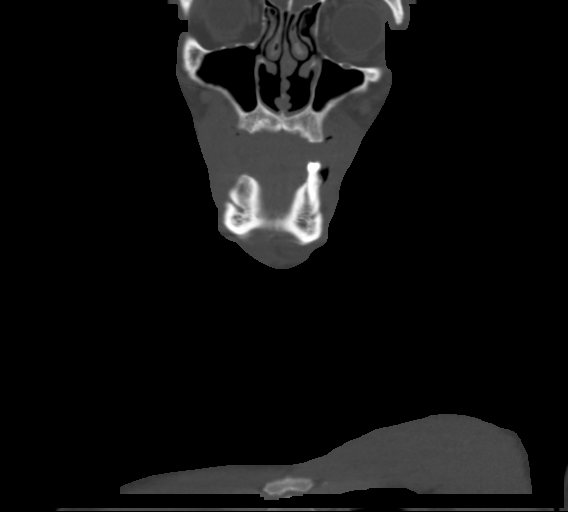
[im 46/115  bone]
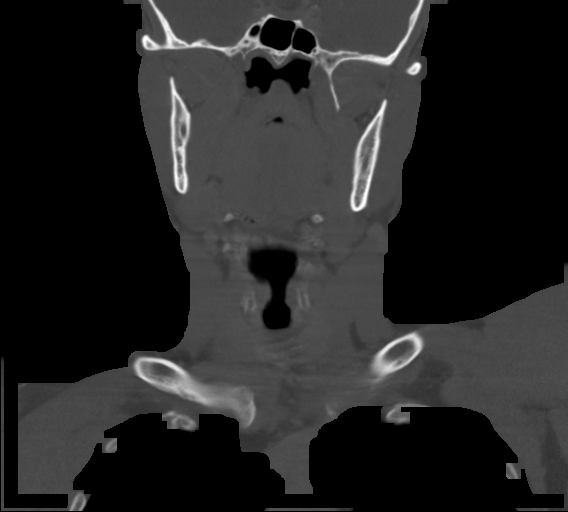
[im 69/115  bone]
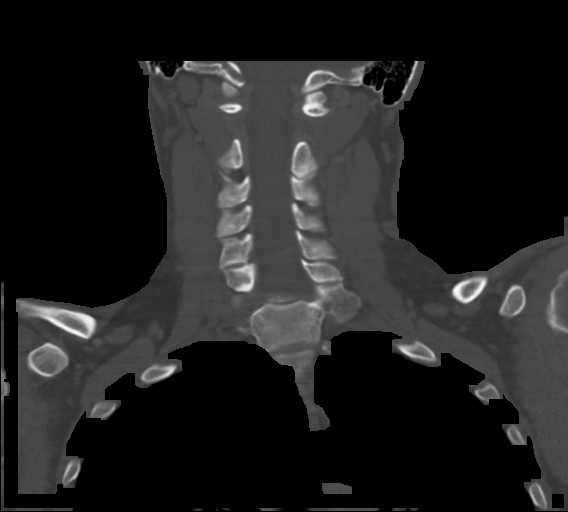

[Series 7: sagittal neck · sagittal · 0.48mm/px · 5 of 134 slices shown, 6 images]
[im 45/134  bone]
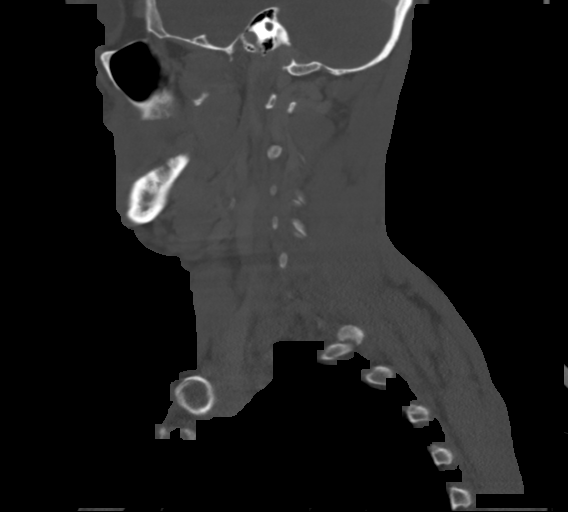
[im 56/134  bone]
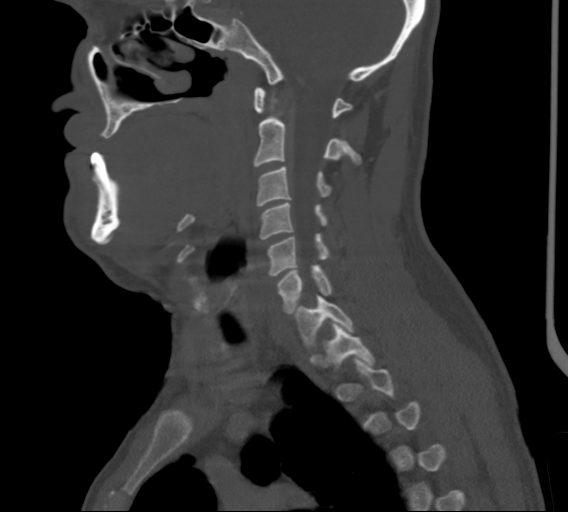
[im 67/134  soft-tissue]
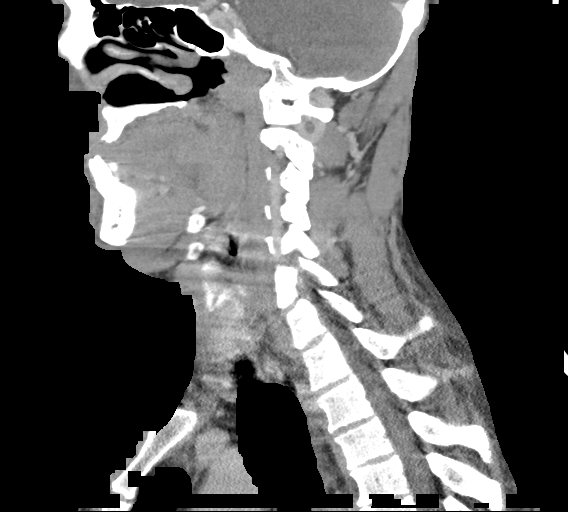
[im 67/134  bone]
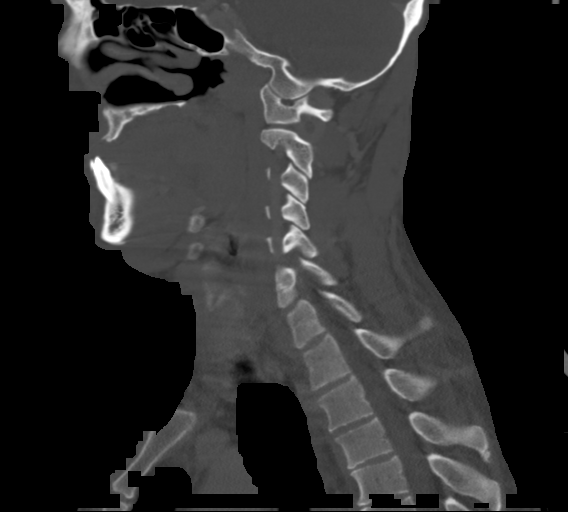
[im 78/134  bone]
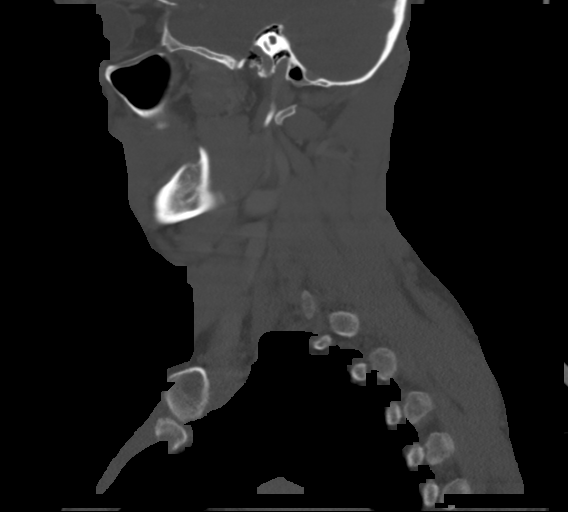
[im 89/134  bone]
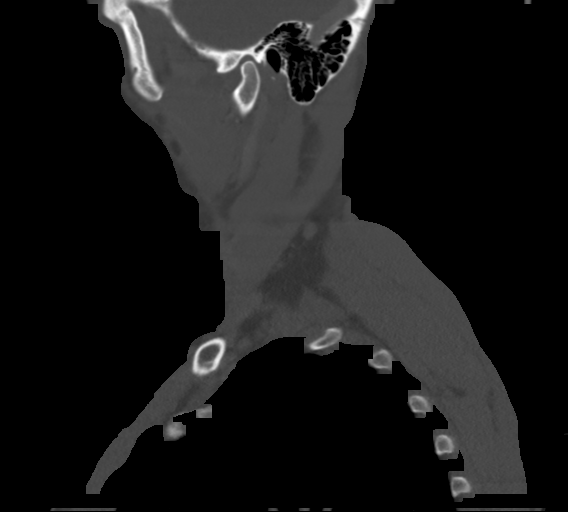

[Series 8: orthogonal ax · axial · 0.50mm/px · z∈[-16,+128]mm · 4 of 122 slices shown]
[im 25/122  bone]
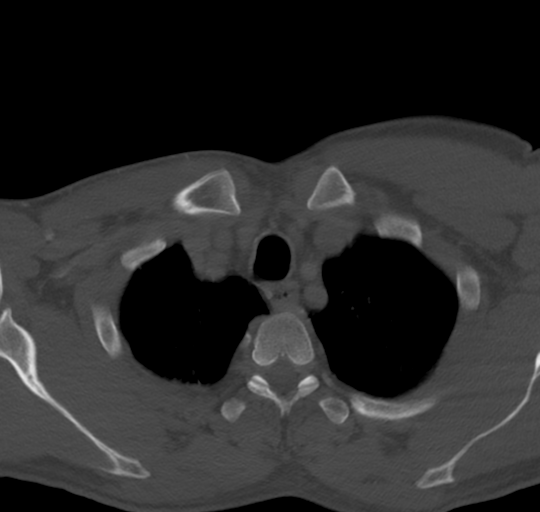
[im 49/122  bone]
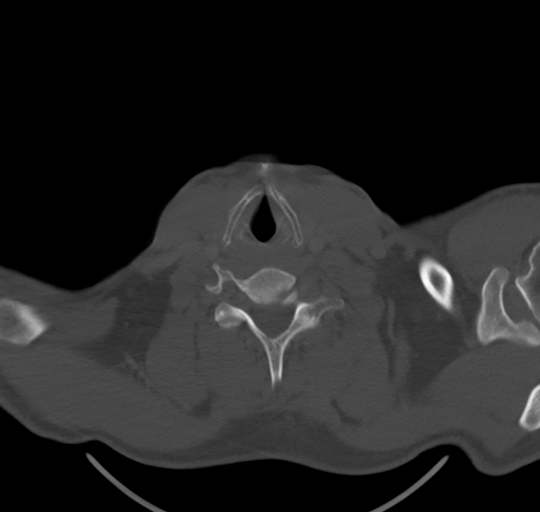
[im 73/122  bone]
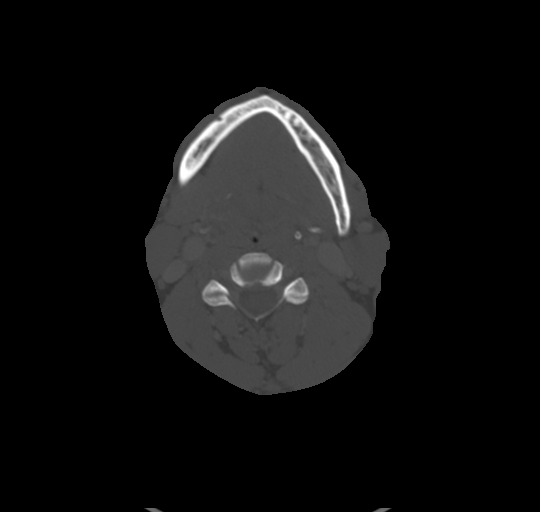
[im 97/122  bone]
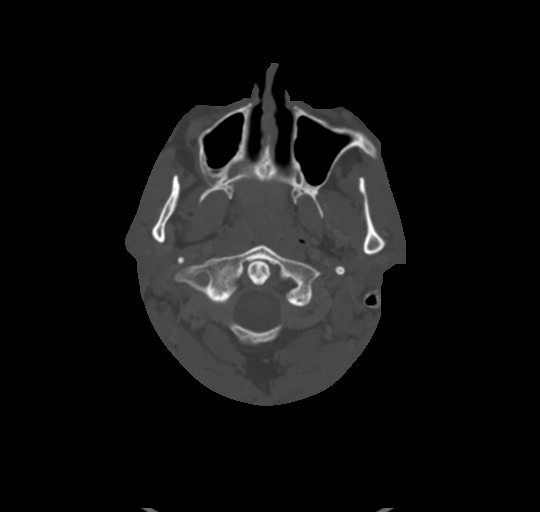

[16 of 33 positions shown; findings below may reference images not displayed]

FINDINGS: PHARYNX AND LARYNX:

--Nasopharynx: Fossae of [place] are clear. Normal adenoid
tonsils for age.

--Oral cavity and oropharynx: The palatine and lingual tonsils are
normal. The visible oral cavity and floor of mouth are normal.

--Hypopharynx: Normal vallecula and pyriform sinuses.

--Larynx: Normal epiglottis and pre-epiglottic space. Normal
aryepiglottic and vocal folds.

--Retropharyngeal space: No abscess, effusion or lymphadenopathy.

SALIVARY GLANDS:

--Parotid: No mass lesion or inflammation. No sialolithiasis or
ductal dilatation.

--Submandibular: Symmetric without inflammation. No sialolithiasis
or ductal dilatation.

--Sublingual: Normal. No ranula or other visible lesion of the base
of tongue and floor of mouth.

THYROID: Normal.

LYMPH NODES: No enlarged or abnormal density lymph nodes.

VASCULAR: Major cervical vessels are patent.

LIMITED INTRACRANIAL: Normal.

VISUALIZED ORBITS: Normal.

MASTOIDS AND VISUALIZED PARANASAL SINUSES: No fluid levels or
advanced mucosal thickening. No mastoid effusion.

SKELETON: No bony spinal canal stenosis. No lytic or blastic
lesions.

UPPER CHEST: Biapical emphysema with multiple bullae.

OTHER: None.
IMPRESSION: 1. No cervical lymphadenopathy or other soft tissue mass.
2.  Emphysema ([id number]).
